# Patient Record
Sex: Male | Born: 1960 | Race: Black or African American | Hispanic: No | Smoking: Current every day smoker
Health system: Southern US, Community
[De-identification: ages and names within clinical notes are randomized; demographics above are authoritative.]

---

## 2000-09-21 ENCOUNTER — Emergency Department (HOSPITAL_COMMUNITY): Admission: EM | Admit: 2000-09-21 | Discharge: 2000-09-21 | Payer: Self-pay | Admitting: Emergency Medicine

## 2004-10-29 ENCOUNTER — Emergency Department (HOSPITAL_COMMUNITY): Admission: EM | Admit: 2004-10-29 | Discharge: 2004-10-29 | Payer: Self-pay | Admitting: Family Medicine

## 2006-11-14 IMAGING — CR DG FOOT COMPLETE 3+V*L*
3 series · 3 of 3 positions shown · non-contrast
Comparison: none

CLINICAL DATA: Left foot pain and swelling. 
 LEFT FOOT - THREE VIEW:
 Soft tissue swelling is seen overlying the little toe.  Fragmentation of the sesamoid bones is seen at the 5th metacarpophalangeal joint, and this could be due to chronic trauma although an acute sesamoid fracture cannot be excluded.  
 No other acute bone abnormalities are seen.  Alignment is normal.  Small dorsal and plantar calcaneal spurs are noted.

[view not recorded (1 of 3)]
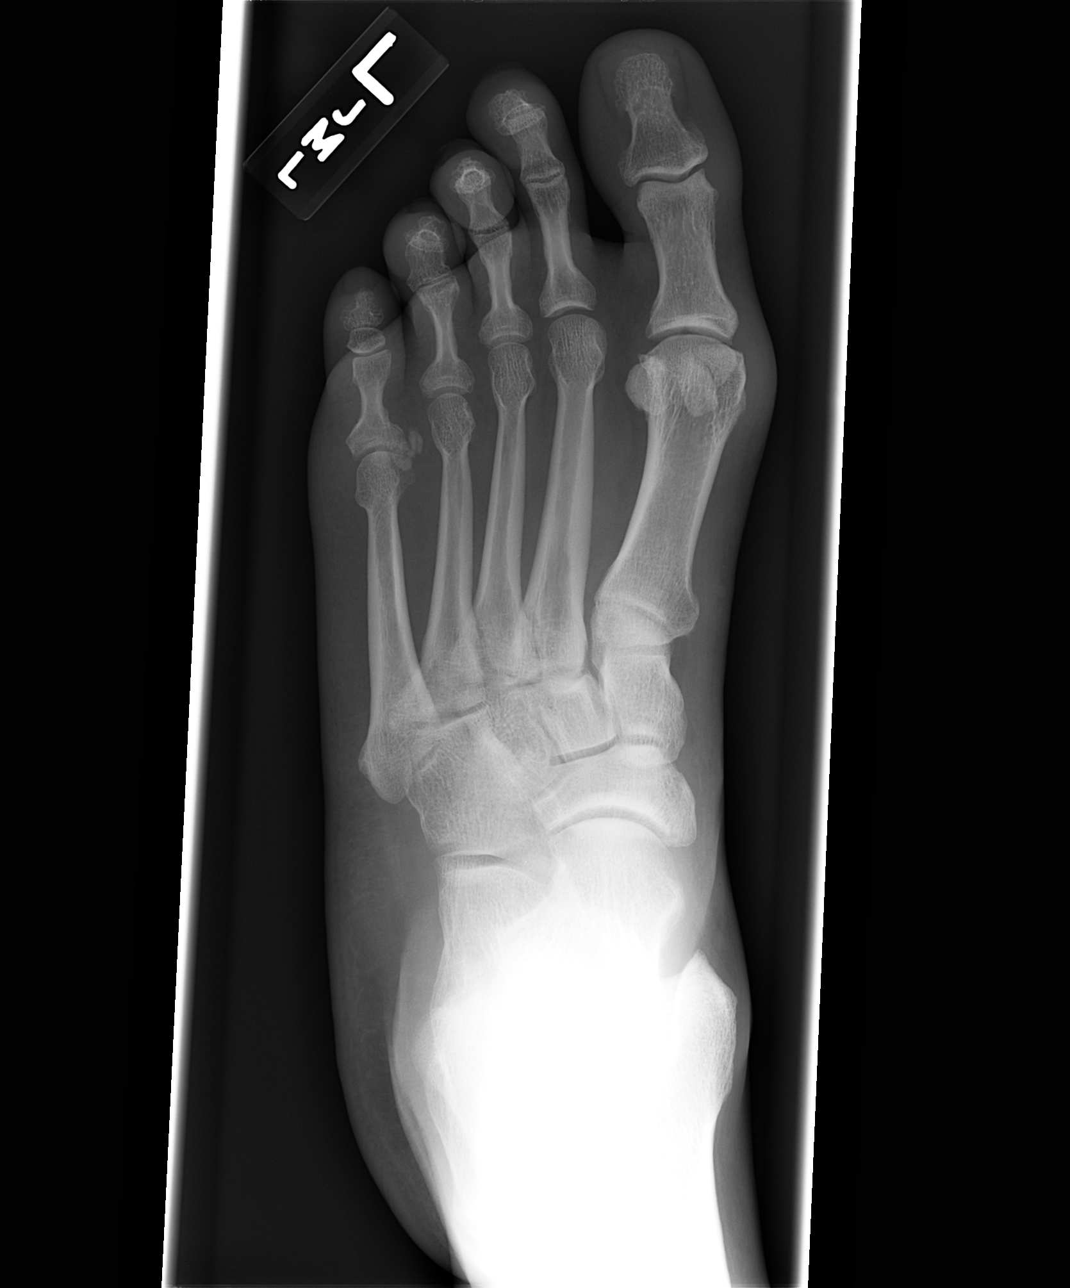

[view not recorded (2 of 3)]
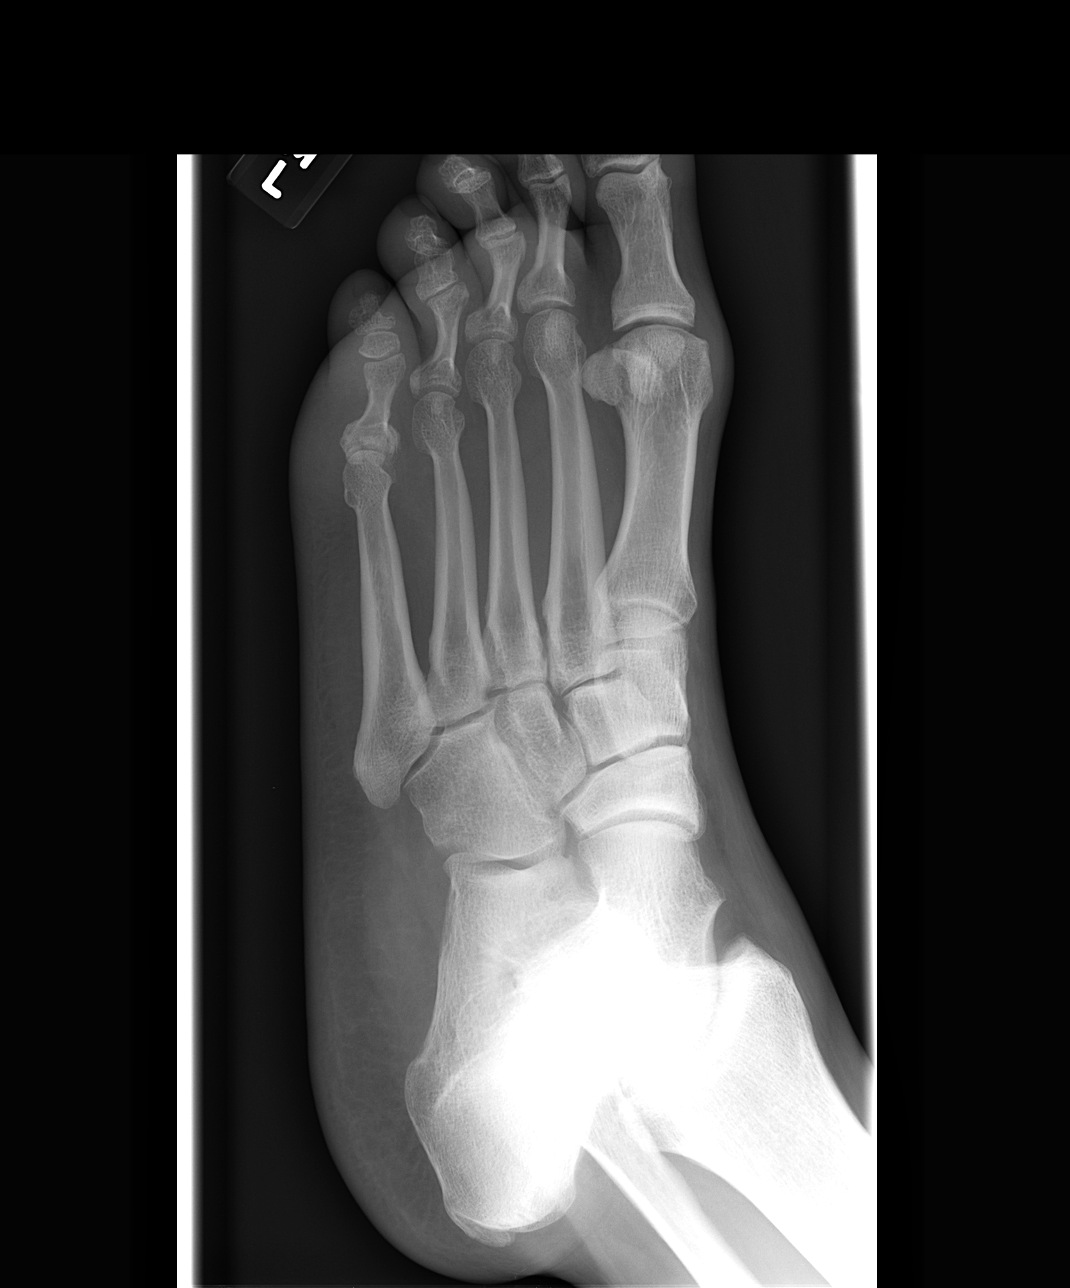

[view not recorded (3 of 3)]
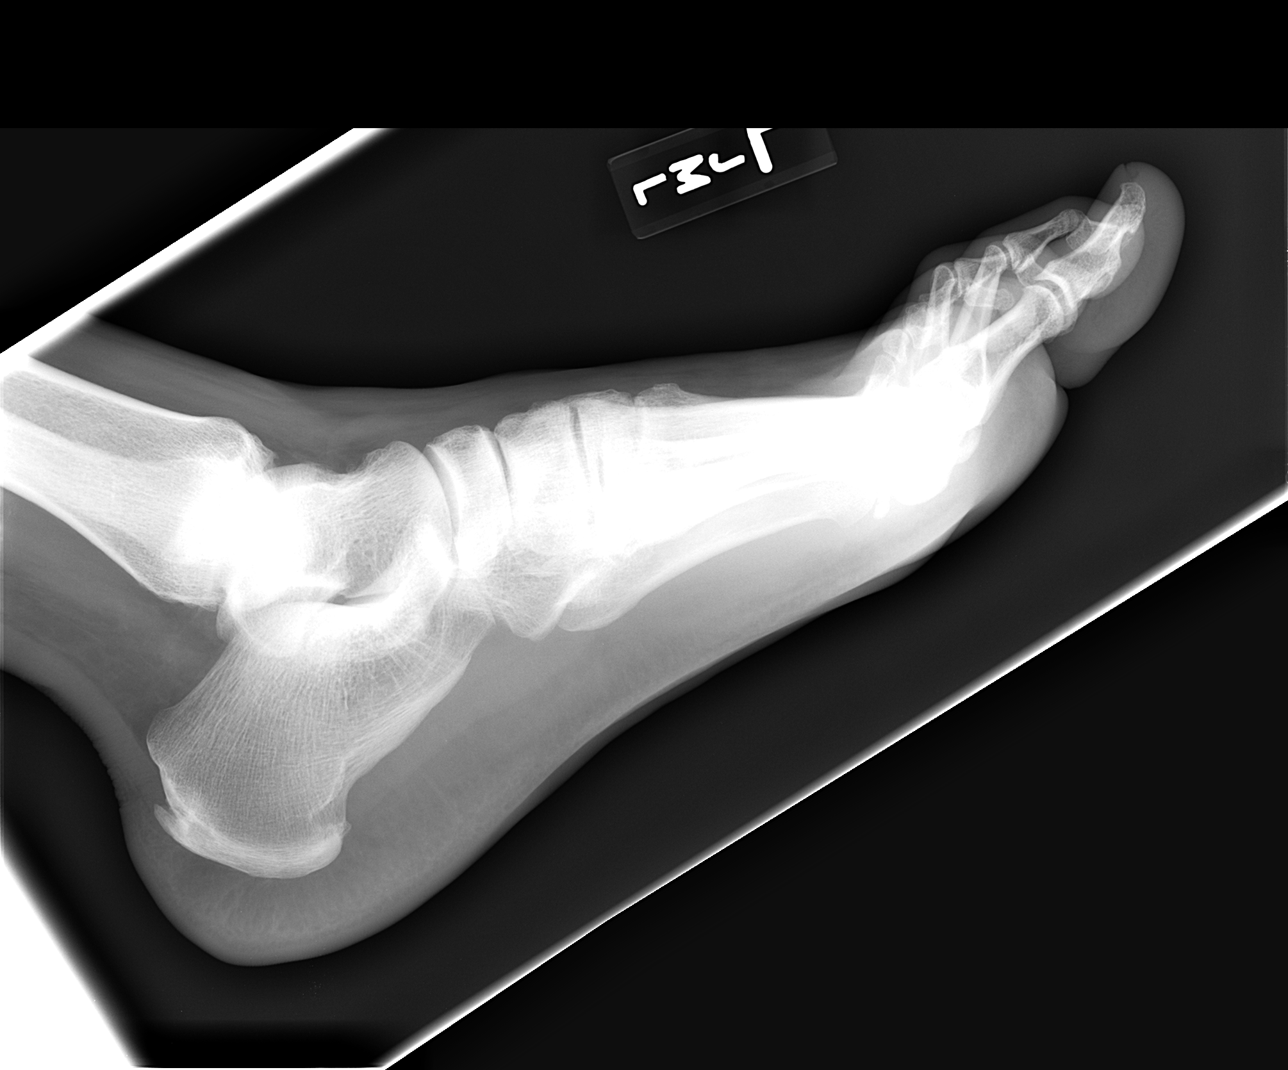

[3 of 3 positions shown; findings below may reference images not displayed]

IMPRESSION: Soft tissue swelling of the little toe.  Fragmentation of the sesamoid bones is seen at the 5th metatarsophalangeal joint, and this could be due to chronic trauma or an acute sesamoid fracture; clinical correlation is recommended for point tenderness at this site.

## 2017-06-14 ENCOUNTER — Other Ambulatory Visit: Payer: Self-pay

## 2017-06-14 ENCOUNTER — Emergency Department (HOSPITAL_COMMUNITY)
Admission: EM | Admit: 2017-06-14 | Discharge: 2017-06-14 | Disposition: A | Discharge status: Home / Self Care | Payer: Self-pay | Attending: Emergency Medicine | Admitting: Emergency Medicine

## 2017-06-14 ENCOUNTER — Encounter (HOSPITAL_COMMUNITY): Payer: Self-pay | Admitting: Emergency Medicine

## 2017-06-14 ENCOUNTER — Ambulatory Visit (HOSPITAL_COMMUNITY)
Admission: EM | Admit: 2017-06-14 | Discharge: 2017-06-14 | Disposition: A | Discharge status: Home / Self Care | Payer: Self-pay | Attending: Internal Medicine | Admitting: Internal Medicine

## 2017-06-14 DIAGNOSIS — H547 Unspecified visual loss: Secondary | ICD-10-CM | POA: Insufficient documentation

## 2017-06-14 DIAGNOSIS — R739 Hyperglycemia, unspecified: Secondary | ICD-10-CM | POA: Insufficient documentation

## 2017-06-14 DIAGNOSIS — F17210 Nicotine dependence, cigarettes, uncomplicated: Secondary | ICD-10-CM | POA: Insufficient documentation

## 2017-06-14 DIAGNOSIS — R631 Polydipsia: Secondary | ICD-10-CM

## 2017-06-14 DIAGNOSIS — E119 Type 2 diabetes mellitus without complications: Secondary | ICD-10-CM

## 2017-06-14 DIAGNOSIS — R358 Other polyuria: Secondary | ICD-10-CM

## 2017-06-14 DIAGNOSIS — R824 Acetonuria: Secondary | ICD-10-CM

## 2017-06-14 DIAGNOSIS — R3589 Other polyuria: Secondary | ICD-10-CM

## 2017-06-14 LAB — POCT I-STAT, CHEM 8
BUN: 12 mg/dL (ref 6–20)
CREATININE: 0.9 mg/dL (ref 0.61–1.24)
Calcium, Ion: 1.21 mmol/L (ref 1.15–1.40)
Chloride: 98 mmol/L — ABNORMAL LOW (ref 101–111)
Glucose, Bld: 397 mg/dL — ABNORMAL HIGH (ref 65–99)
HEMATOCRIT: 50 % (ref 39.0–52.0)
HEMOGLOBIN: 17 g/dL (ref 13.0–17.0)
POTASSIUM: 4.8 mmol/L (ref 3.5–5.1)
SODIUM: 137 mmol/L (ref 135–145)
TCO2: 28 mmol/L (ref 22–32)

## 2017-06-14 LAB — BASIC METABOLIC PANEL
Anion gap: 9 (ref 5–15)
BUN: 8 mg/dL (ref 6–20)
CO2: 25 mmol/L (ref 22–32)
Calcium: 9.2 mg/dL (ref 8.9–10.3)
Chloride: 99 mmol/L — ABNORMAL LOW (ref 101–111)
Creatinine, Ser: 0.97 mg/dL (ref 0.61–1.24)
GFR calc Af Amer: >60 mL/min (ref >60)
GFR calc non Af Amer: >60 mL/min (ref >60)
Glucose, Bld: 402 mg/dL — ABNORMAL HIGH (ref 65–99)
Potassium: 4.6 mmol/L (ref 3.5–5.1)
Sodium: 133 mmol/L — ABNORMAL LOW (ref 135–145)

## 2017-06-14 LAB — CBC
HCT: 43.1 % (ref 39.0–52.0)
Hemoglobin: 14.9 g/dL (ref 13.0–17.0)
MCH: 30.2 pg (ref 26.0–34.0)
MCHC: 34.6 g/dL (ref 30.0–36.0)
MCV: 87.4 fL (ref 78.0–100.0)
Platelets: 373 10*3/uL (ref 150–400)
RBC: 4.93 MIL/uL (ref 4.22–5.81)
RDW: 12.8 % (ref 11.5–15.5)
WBC: 9.3 10*3/uL (ref 4.0–10.5)

## 2017-06-14 LAB — URINALYSIS, ROUTINE W REFLEX MICROSCOPIC
Bacteria, UA: NONE SEEN
Bilirubin Urine: NEGATIVE
Glucose, UA: >=500 mg/dL — AB
Hgb urine dipstick: NEGATIVE
Ketones, ur: 20 mg/dL — AB
Leukocytes, UA: NEGATIVE
Nitrite: NEGATIVE
Protein, ur: NEGATIVE mg/dL
Specific Gravity, Urine: 1.039 — ABNORMAL HIGH (ref 1.005–1.030)
pH: 5 (ref 5.0–8.0)

## 2017-06-14 LAB — POCT URINALYSIS DIP (DEVICE)
Bilirubin Urine: NEGATIVE
GLUCOSE, UA: 500 mg/dL — AB
Ketones, ur: 40 mg/dL — AB
LEUKOCYTES UA: NEGATIVE
NITRITE: NEGATIVE
PROTEIN: NEGATIVE mg/dL
SPECIFIC GRAVITY, URINE: 1.015 (ref 1.005–1.030)
UROBILINOGEN UA: 0.2 mg/dL (ref 0.0–1.0)
pH: 5 (ref 5.0–8.0)

## 2017-06-14 LAB — CBG MONITORING, ED
Glucose-Capillary: 367 mg/dL — ABNORMAL HIGH (ref 65–99)
Glucose-Capillary: 295 mg/dL — ABNORMAL HIGH (ref 65–99)

## 2017-06-14 LAB — GLUCOSE, CAPILLARY: Glucose-Capillary: 372 mg/dL — ABNORMAL HIGH (ref 65–99)

## 2017-06-14 MED ORDER — BLOOD GLUCOSE MONITOR KIT: PACK | 0 refills | Status: AC

## 2017-06-14 MED ORDER — SODIUM CHLORIDE 0.9 % IV BOLUS (SEPSIS)
1000.0000 mL | Freq: Once | INTRAVENOUS | Status: AC
  Administered 2017-06-14: 1000 mL via INTRAVENOUS

## 2017-06-14 MED ORDER — METFORMIN HCL 500 MG PO TABS: 500.0000 mg | ORAL_TABLET | Freq: Every day | ORAL | 0 refills | Status: AC

## 2017-06-14 MED ORDER — INSULIN ASPART 100 UNIT/ML ~~LOC~~ SOLN
8.0000 [IU] | Freq: Once | SUBCUTANEOUS | Status: AC
  Administered 2017-06-14: 8 [IU] via SUBCUTANEOUS
  Filled 2017-06-14: qty 1

## 2017-06-14 NOTE — ED Provider Notes (Signed)
[facility]    CSN: [id number] Arrival date & time: 06/14/17  1005     History   Chief Complaint Chief Complaint  Patient presents with  . Polydipsia  . Flashes/floaters    HPI NAVY BELAY is a 56 y.o. male.   56 year old male mildly obese complaining of a two-week history of polyuria, polydipsia, nocturia and visual changes. He states he has a family history of diabetes and believes that he has same symptoms as they do. His blood sugars currently 372. He is fully awake, alert, oriented and showing no signs of distress. Denies chest pain, shortness of breath, edema, abdominal pain nausea or vomiting.      History reviewed. No pertinent past medical history.  There are no active problems to display for this patient.   History reviewed. No pertinent surgical history.     Home Medications    Prior to Admission medications   Medication Sig Start Date End Date Taking? Authorizing Provider  blood glucose meter kit and supplies KIT Dispense based on patient and insurance preference. Use up to four times daily as directed. (FOR ICD-9 250.00, 250.01). 06/14/17   Hedges, Dellis Filbert, PA-C  metFORMIN (GLUCOPHAGE) 500 MG tablet Take 1 tablet (500 mg total) by mouth daily with breakfast. 06/14/17   Okey Regal, PA-C    Family History Family History  Problem Relation Age of Onset  . Diabetes Sister     Social History Social History   Tobacco Use  . Smoking status: Current Every Day Smoker    Packs/day: 0.50    Types: Cigarettes  . Smokeless tobacco: Never Used  Substance Use Topics  . Alcohol use: No    Frequency: Never  . Drug use: No     Allergies   Patient has no known allergies.   Review of Systems Review of Systems  Constitutional: Positive for activity change. Negative for fever.  Eyes: Positive for visual disturbance.  Respiratory: Negative.   Gastrointestinal: Negative.   Endocrine: Positive for polydipsia, polyphagia and polyuria.   Neurological: Negative.   All other systems reviewed and are negative.    Physical Exam Triage Vital Signs ED Triage Vitals [06/14/17 1031]  Enc Vitals Group     BP 127/89     Pulse Rate 83     Resp 20     Temp 98.5 F (36.9 C)     Temp Source Oral     SpO2 100 %     Weight      Height      Head Circumference      Peak Flow      Pain Score      Pain Loc      Pain Edu?      Excl. in [place]?    No data found.  Updated Vital Signs BP 127/89 (BP Location: Left Arm)   Pulse 83   Temp 98.5 F (36.9 C) (Oral)   Resp 20   SpO2 100%   Visual Acuity Right Eye Distance:   Left Eye Distance:   Bilateral Distance:    Right Eye Near:   Left Eye Near:    Bilateral Near:     Physical Exam  Constitutional: He is oriented to person, place, and time. He appears well-developed and well-nourished. No distress.  Eyes: EOM are normal.  Neck: Normal range of motion. Neck supple.  Cardiovascular: Normal rate, regular rhythm, normal heart sounds and intact distal pulses.  Pulmonary/Chest: Effort normal. No respiratory distress. He has  no wheezes. He has no rales.  Musculoskeletal: He exhibits no edema.  Neurological: He is alert and oriented to person, place, and time. He exhibits normal muscle tone.  Skin: Skin is warm and dry.  Psychiatric: He has a normal mood and affect.  Nursing note and vitals reviewed.    UC Treatments / Results  Labs (all labs ordered are listed, but only abnormal results are displayed) Labs Reviewed  GLUCOSE, CAPILLARY - Abnormal; Notable for the following components:      Result Value   Glucose-Capillary 372 (*)    All other components within normal limits  POCT URINALYSIS DIP (DEVICE) - Abnormal; Notable for the following components:   Glucose, UA 500 (*)    Ketones, ur 40 (*)    Hgb urine dipstick SMALL (*)    All other components within normal limits  POCT I-STAT, CHEM 8 - Abnormal; Notable for the following components:   Chloride 98 (*)     Glucose, Bld 397 (*)    All other components within normal limits    EKG  EKG Interpretation None       Radiology No results found.  Procedures Procedures (including critical care time)  Medications Ordered in UC Medications - No data to display   Initial Impression / Assessment and Plan / UC Course  I have reviewed the triage vital signs and the nursing notes.  Pertinent labs & imaging results that were available during my care of the patient were reviewed by me and considered in my medical decision making (see chart for details).       Final Clinical Impressions(s) / UC Diagnoses   Final diagnoses:  Newly diagnosed diabetes ([place])  Polyuria  Polydipsia  Ketonuria  Hyperglycemia    ED Discharge Orders    None       Controlled Substance Prescriptions [place] Controlled Substance Registry consulted? Not Applicable   Janne Napoleon, NP 06/14/17 2122

## 2017-06-14 NOTE — Discharge Instructions (Signed)
To Ed now for eval and initial treatment for high blood sugar and diabetes

## 2017-06-14 NOTE — Discharge Instructions (Signed)
Please read attached information. If you experience any new or worsening signs or symptoms please return to the emergency room for evaluation. Please use resources provided to establish care with a primary care physician. I would also like you to follow up with ophthalmology as soon as possible for eye exam. Please read information attacked about diabetes.

## 2017-06-14 NOTE — ED Triage Notes (Signed)
Pt reports being sent by [facility] for hyperglycemia. Pt reports increased thirst, frequent urination. Pt denies pain, n/v/d.

## 2017-06-14 NOTE — ED Provider Notes (Signed)
[place]    CSN: [id number] Arrival date & time: 06/14/17  1203     History   Chief Complaint Chief Complaint  Patient presents with  . Hyperglycemia    HPI Patrick Pugh is a 56 y.o. male.   56 year old male presents to the urgent care complaining of polydipsia, polyuria, urinary frequency and change in vision for the past 2 weeks. He states he has a family history of diabetes and believes that he is now having those symptoms.       History reviewed. No pertinent past medical history.  There are no active problems to display for this patient.   History reviewed. No pertinent surgical history.     Home Medications    Prior to Admission medications   Medication Sig Start Date End Date Taking? Authorizing Provider  blood glucose meter kit and supplies KIT Dispense based on patient and insurance preference. Use up to four times daily as directed. (FOR ICD-9 250.00, 250.01). 06/14/17   Hedges, Dellis Filbert, PA-C  metFORMIN (GLUCOPHAGE) 500 MG tablet Take 1 tablet (500 mg total) by mouth daily with breakfast. 06/14/17   Okey Regal, PA-C    Family History Family History  Problem Relation Age of Onset  . Diabetes Sister     Social History Social History   Tobacco Use  . Smoking status: Current Every Day Smoker    Packs/day: 0.50    Types: Cigarettes  . Smokeless tobacco: Never Used  Substance Use Topics  . Alcohol use: No    Frequency: Never  . Drug use: No     Allergies   Patient has no known allergies.   Review of Systems Review of Systems  Constitutional: Positive for activity change.  HENT: Negative.   Eyes: Positive for visual disturbance.  Respiratory: Negative.   Cardiovascular: Negative.  Negative for chest pain.  Gastrointestinal: Negative.   Endocrine: Positive for polydipsia and polyuria. Negative for polyphagia.  Genitourinary: Positive for frequency. Negative for difficulty urinating and discharge.  Musculoskeletal:  Negative.   All other systems reviewed and are negative.    Physical Exam Triage Vital Signs ED Triage Vitals [06/14/17 1214]  Enc Vitals Group     BP (!) 133/95     Pulse Rate 82     Resp 20     Temp 97.7 F (36.5 C)     Temp Source Oral     SpO2 99 %     Weight      Height      Head Circumference      Peak Flow      Pain Score      Pain Loc      Pain Edu?      Excl. in [place]?    No data found.  Updated Vital Signs BP (!) 127/93   Pulse 60   Temp 97.7 F (36.5 C) (Oral)   Resp 18   SpO2 100%   Visual Acuity Right Eye Distance:   Left Eye Distance:   Bilateral Distance:    Right Eye Near:   Left Eye Near:    Bilateral Near:     Physical Exam  Constitutional: He is oriented to person, place, and time. He appears well-developed and well-nourished. No distress.  Eyes: EOM are normal.  Neck: Normal range of motion. Neck supple.  Cardiovascular: Normal rate, regular rhythm and normal heart sounds.  Pulmonary/Chest: Effort normal and breath sounds normal. No respiratory distress.  Musculoskeletal: He exhibits no edema.  Neurological: He is alert and oriented to person, place, and time. He exhibits normal muscle tone.  Skin: Skin is warm and dry.  Psychiatric: He has a normal mood and affect.  Nursing note and vitals reviewed.    UC Treatments / Results  Labs (all labs ordered are listed, but only abnormal results are displayed) Labs Reviewed  BASIC METABOLIC PANEL - Abnormal; Notable for the following components:      Result Value   Sodium 133 (*)    Chloride 99 (*)    Glucose, Bld 402 (*)    All other components within normal limits  URINALYSIS, ROUTINE W REFLEX MICROSCOPIC - Abnormal; Notable for the following components:   Specific Gravity, Urine 1.039 (*)    Glucose, UA >=500 (*)    Ketones, ur 20 (*)    Squamous Epithelial / LPF 0-5 (*)    All other components within normal limits  CBG MONITORING, ED - Abnormal; Notable for the following  components:   Glucose-Capillary 367 (*)    All other components within normal limits  CBG MONITORING, ED - Abnormal; Notable for the following components:   Glucose-Capillary 295 (*)    All other components within normal limits  CBC    EKG  EKG Interpretation None       Radiology No results found.  Procedures Procedures (including critical care time)  Medications Ordered in UC Medications  sodium chloride 0.9 % bolus 1,000 mL (0 mLs Intravenous Stopped 06/14/17 1638)  insulin aspart (novoLOG) injection 8 Units (8 Units Subcutaneous Given 06/14/17 1533)     Initial Impression / Assessment and Plan / UC Course  I have reviewed the triage vital signs and the nursing notes.  Pertinent labs & imaging results that were available during my care of the patient were reviewed by me and considered in my medical decision making (see chart for details).    TO the ED for eval and tx No PCP   Final Clinical Impressions(s) / UC Diagnoses   Final diagnoses:  Hyperglycemia  Decreased visual acuity    ED Discharge Orders        Ordered    metFORMIN (GLUCOPHAGE) 500 MG tablet  Daily with breakfast     06/14/17 1557    blood glucose meter kit and supplies KIT     06/14/17 1616       Controlled Substance Prescriptions [place] Controlled Substance Registry consulted? Not Applicable   Janne Napoleon, NP 06/14/17 2116

## 2017-06-14 NOTE — ED Provider Notes (Signed)
[facility] EMERGENCY DEPARTMENT Provider Note   CSN: [id number] Arrival date & time: 06/14/17  1203     History   Chief Complaint Chief Complaint  Patient presents with  . Hyperglycemia    HPI Patrick Pugh is a 56 y.o. male.  HPI   79 YOM presents today with elevated blood sugar. Pt was seen at urgent care today after several weeks of worsening polydipsia, polyuria, and gradual vision changes.  Patient notes that over several months he has noticed slightly decreased him to squint.  He notes that he has become more thirsty with more frequent urinations, reports that he gets very little sleep due to frequent urinations.  Patient denies any history of the same, denies any history of diabetes.  He reports his father has diabetes, mother does not.  Patient reports a poor diet with high glycemic foods.  Patient also notes that he has had intermittent spots in his vision that come and go, none presently, no visual field deficits, no recent trauma.  She reports he is otherwise been healthy, does not have a primary care provider, does not take any daily medications.  He was seen in urgent care today and sent over here for further evaluation and management.  History reviewed. No pertinent past medical history.  There are no active problems to display for this patient.   History reviewed. No pertinent surgical history.     Home Medications    Prior to Admission medications   Medication Sig Start Date End Date Taking? Authorizing Provider  blood glucose meter kit and supplies KIT Dispense based on patient and insurance preference. Use up to four times daily as directed. (FOR ICD-9 250.00, 250.01). 06/14/17   Hedges, Dellis Filbert, PA-C  metFORMIN (GLUCOPHAGE) 500 MG tablet Take 1 tablet (500 mg total) by mouth daily with breakfast. 06/14/17   Okey Regal, PA-C    Family History Family History  Problem Relation Age of Onset  . Diabetes Sister     Social  History Social History   Tobacco Use  . Smoking status: Current Every Day Smoker    Packs/day: 0.50    Types: Cigarettes  . Smokeless tobacco: Never Used  Substance Use Topics  . Alcohol use: No    Frequency: Never  . Drug use: No     Allergies   Patient has no known allergies.   Review of Systems Review of Systems  All other systems reviewed and are negative.  Physical Exam Updated Vital Signs BP 117/88 (BP Location: Left Arm)   Pulse 69   Temp 97.7 F (36.5 C) (Oral)   Resp 18   SpO2 98%   Physical Exam  Constitutional: He is oriented to person, place, and time. He appears well-developed and well-nourished.  HENT:  Head: Normocephalic and atraumatic.  Eyes: Conjunctivae are normal. Pupils are equal, round, and reactive to light. Right eye exhibits no discharge. Left eye exhibits no discharge. No scleral icterus. Right eye exhibits normal extraocular motion and no nystagmus. Left eye exhibits normal extraocular motion and no nystagmus. Right pupil is round and reactive. Left pupil is round and reactive. Pupils are equal.  Minimal view on fundoscopic exam   Neck: Normal range of motion. No JVD present. No tracheal deviation present.  Pulmonary/Chest: Effort normal. No stridor.  Neurological: He is alert and oriented to person, place, and time. Coordination normal.  Psychiatric: He has a normal mood and affect. His behavior is normal. Judgment and thought content normal.  Nursing  note and vitals reviewed.    ED Treatments / Results  Labs (all labs ordered are listed, but only abnormal results are displayed) Labs Reviewed  BASIC METABOLIC PANEL - Abnormal; Notable for the following components:      Result Value   Sodium 133 (*)    Chloride 99 (*)    Glucose, Bld 402 (*)    All other components within normal limits  URINALYSIS, ROUTINE W REFLEX MICROSCOPIC - Abnormal; Notable for the following components:   Specific Gravity, Urine 1.039 (*)    Glucose, UA >=500  (*)    Ketones, ur 20 (*)    Squamous Epithelial / LPF 0-5 (*)    All other components within normal limits  CBG MONITORING, ED - Abnormal; Notable for the following components:   Glucose-Capillary 367 (*)    All other components within normal limits  CBG MONITORING, ED - Abnormal; Notable for the following components:   Glucose-Capillary 295 (*)    All other components within normal limits  CBC    EKG  EKG Interpretation None       Radiology No results found.  Procedures Procedures (including critical care time)  Medications Ordered in ED Medications  sodium chloride 0.9 % bolus 1,000 mL (1,000 mLs Intravenous New Bag/Given 06/14/17 1533)  insulin aspart (novoLOG) injection 8 Units (8 Units Subcutaneous Given 06/14/17 1533)     Initial Impression / Assessment and Plan / ED Course  I have reviewed the triage vital signs and the nursing notes.  Pertinent labs & imaging results that were available during my care of the patient were reviewed by me and considered in my medical decision making (see chart for details).      Final Clinical Impressions(s) / ED Diagnoses   Final diagnoses:  Hyperglycemia  Decreased visual acuity    Labs: BMP, CBC, CBG  Imaging:  Consults:  Therapeutics: Insulin regular, normal saline  Discharge Meds:   Assessment/Plan: 56 year old male presents today with hyperglycemia.  No signs of DKA, no signs of hyperosmolar state.  This is patient's initial presentation for hyperglycemia.  Patient will be given fluids, insulin with anticipated discharge on metformin with close primary care follow-up. Repeat CBG after 500 ml and 8 units of sub q insulin.  Patient also reports vague complaints of floaters, none presently, I highly encourage patient to follow-up closely with ophthalmology as well.  I have very low suspicion for acute retinal detachment or tear at this time.  Patient was given strict return precautions, verbalized understanding and  agreement to today's plan and no further questions or concerns at time of discharge.   ED Discharge Orders        Ordered    metFORMIN (GLUCOPHAGE) 500 MG tablet  Daily with breakfast     06/14/17 1557    blood glucose meter kit and supplies KIT     06/14/17 1616       Francee Gentile 06/14/17 1619    Virgel Manifold, MD 06/14/17 1627

## 2017-06-14 NOTE — ED Triage Notes (Signed)
PT C/O: concerned about DM.... Family hx of DM.   ONSET: 2 weeks  SX INCLUDE: polydipsia, polyuria, decreased appetite, floaters on bilateral eyes, decreased wt  DENIES:   TAKING MEDS: none  A&O x4... NAD... Ambulatory

## 2019-12-02 ENCOUNTER — Ambulatory Visit: Payer: Self-pay | Attending: Internal Medicine

## 2019-12-02 DIAGNOSIS — Z23 Encounter for immunization: Secondary | ICD-10-CM

## 2019-12-02 NOTE — Progress Notes (Signed)
   Covid-19 Vaccination Clinic  Name:  Patrick Pugh    MRN: [id number] DOB: 01-19-1961  12/02/2019  Mr. [name] was observed post Covid-19 immunization for 15 minutes without incident. He was provided with Vaccine Information Sheet and instruction to access the V-Safe system.   Mr. [name] was instructed to call 911 with any severe reactions post vaccine: Marland Kitchen Difficulty breathing  . Swelling of face and throat  . A fast heartbeat  . A bad rash all over body  . Dizziness and weakness   Immunizations Administered    Name Date Dose VIS Date Route   Pfizer COVID-19 Vaccine 12/02/2019  8:29 AM 0.3 mL 09/16/2018 Intramuscular   Manufacturer: [employer], [employer]   Lot: [id number]   NDC: [address]-5

## 2019-12-24 ENCOUNTER — Ambulatory Visit: Payer: Self-pay | Attending: Internal Medicine

## 2019-12-24 DIAGNOSIS — Z23 Encounter for immunization: Secondary | ICD-10-CM

## 2019-12-24 NOTE — Progress Notes (Signed)
   Covid-19 Vaccination Clinic  Name:  Patrick Pugh    MRN: [id number] DOB: [birth year]  12/24/2019  Patrick Pugh was observed post Covid-19 immunization for 15 minutes without incident. He was provided with Vaccine Information Sheet and instruction to access the V-Safe system.   Patrick Pugh was instructed to call 911 with any severe reactions post vaccine: Marland Kitchen Difficulty breathing  . Swelling of face and throat  . A fast heartbeat  . A bad rash all over body  . Dizziness and weakness   Immunizations Administered    Name Date Dose VIS Date Route   Pfizer COVID-19 Vaccine 12/24/2019  8:09 AM 0.3 mL 09/16/2018 Intramuscular   Manufacturer: [employer], [employer]   Lot: [id number]   NDC: [address]-6

## 2020-04-21 ENCOUNTER — Ambulatory Visit (INDEPENDENT_AMBULATORY_CARE_PROVIDER_SITE_OTHER): Payer: Self-pay | Admitting: Primary Care
